# Patient Record
Sex: Female | Born: 2000 | Race: White | Hispanic: No | Marital: Single | Smoking: Never smoker
Health system: Southern US, Community
[De-identification: ages and names within clinical notes are randomized; demographics above are authoritative.]

## PROBLEM LIST (undated history)

## (undated) DIAGNOSIS — G43909 Migraine, unspecified, not intractable, without status migrainosus: Secondary | ICD-10-CM

## (undated) HISTORY — PX: WISDOM TOOTH EXTRACTION: SHX21

---
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
## 2019-01-07 ENCOUNTER — Other Ambulatory Visit: Payer: Self-pay | Admitting: Primary Care

## 2019-01-07 DIAGNOSIS — Z20822 Contact with and (suspected) exposure to covid-19: Secondary | ICD-10-CM

## 2019-01-08 LAB — NOVEL CORONAVIRUS, NAA: SARS-CoV-2, NAA: NOT DETECTED

## 2019-07-01 ENCOUNTER — Other Ambulatory Visit: Payer: Self-pay | Admitting: Neurology

## 2019-07-01 DIAGNOSIS — G43019 Migraine without aura, intractable, without status migrainosus: Secondary | ICD-10-CM

## 2019-07-11 ENCOUNTER — Other Ambulatory Visit: Payer: Self-pay

## 2019-07-11 ENCOUNTER — Ambulatory Visit
Admission: RE | Admit: 2019-07-11 | Discharge: 2019-07-11 | Disposition: A | Discharge status: Home / Self Care | Payer: BC Managed Care – PPO | Source: Ambulatory Visit | Attending: Neurology | Admitting: Neurology

## 2019-07-11 DIAGNOSIS — G43019 Migraine without aura, intractable, without status migrainosus: Secondary | ICD-10-CM | POA: Insufficient documentation

## 2020-07-28 ENCOUNTER — Ambulatory Visit
Admission: EM | Admit: 2020-07-28 | Discharge: 2020-07-28 | Disposition: A | Discharge status: Home / Self Care | Payer: BC Managed Care – PPO | Attending: Family Medicine | Admitting: Family Medicine

## 2020-07-28 ENCOUNTER — Other Ambulatory Visit: Payer: Self-pay

## 2020-07-28 ENCOUNTER — Encounter: Payer: Self-pay | Admitting: Emergency Medicine

## 2020-07-28 DIAGNOSIS — N3001 Acute cystitis with hematuria: Secondary | ICD-10-CM | POA: Insufficient documentation

## 2020-07-28 DIAGNOSIS — R300 Dysuria: Secondary | ICD-10-CM | POA: Diagnosis not present

## 2020-07-28 LAB — URINALYSIS, COMPLETE (UACMP) WITH MICROSCOPIC
Bilirubin Urine: NEGATIVE
Glucose, UA: NEGATIVE mg/dL
Nitrite: NEGATIVE
Protein, ur: NEGATIVE mg/dL
Specific Gravity, Urine: 1.025 (ref 1.005–1.030)
pH: 5.5 (ref 5.0–8.0)

## 2020-07-28 MED ORDER — PHENAZOPYRIDINE HCL 200 MG PO TABS: 200.0000 mg | ORAL_TABLET | Freq: Three times a day (TID) | ORAL | 0 refills | Status: AC

## 2020-07-28 MED ORDER — NITROFURANTOIN MONOHYD MACRO 100 MG PO CAPS: 100.0000 mg | ORAL_CAPSULE | Freq: Two times a day (BID) | ORAL | 0 refills | Status: AC

## 2020-07-28 NOTE — ED Provider Notes (Signed)
[facility]    CSN: [id number] Arrival date & time: 07/28/20  1506      History   Chief Complaint Chief Complaint  Patient presents with  . Dysuria    HPI Abigail Schaefer is a 20 y.o. female presenting for onset of dysuria, urinary frequency and urgency as well as lower abdominal cramping today.  She states that she is constipated at times and only has had small amounts of urine put out.  She denies any fever, fatigue, back pain, nausea/vomiting, hematuria, abnormal vaginal discharge/itching/odor or concerns for STIs.  Has not taken any over-the-counter medication for symptoms.  She has no other complaints or concerns.  HPI  Past Medical History:  Diagnosis Date  . Migraine     Patient Active Problem List   Diagnosis Date Noted  . 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 Lateral subluxation of patella, right, initial encounter 06/03/2014    Past Surgical History:  Procedure Laterality Date  . WISDOM TOOTH EXTRACTION      OB History   No obstetric history on file.      Home Medications    Prior to Admission medications   Medication Sig Start Date End Date Taking? Authorizing Provider  nitrofurantoin, macrocrystal-monohydrate, (MACROBID) 100 MG capsule Take 1 capsule (100 mg total) by mouth 2 (two) times daily for 5 days. 07/28/20 08/02/20 Yes Shirlee Latch, PA-C  phenazopyridine (PYRIDIUM) 200 MG tablet Take 1 tablet (200 mg total) by mouth 3 (three) times daily. 07/28/20  Yes Eusebio Friendly B, PA-C  ARIPiprazole (ABILIFY) 2 MG tablet Take 2 mg by mouth every evening. 11/27/17   [provider]  buPROPion (WELLBUTRIN XL) 150 MG 24 hr tablet Take 150 mg by mouth daily. 12/19/17   [provider]  clonazePAM (KLONOPIN) 0.5 MG tablet Take 0.5 mg by mouth daily as needed for anxiety. 12/04/17   [provider]  sertraline (ZOLOFT) 100 MG tablet Take 150 mg by mouth daily. 12/04/17   [provider]    Family  History History reviewed. No pertinent family history.  Social History Social History   Tobacco Use  . Smoking status: Never Smoker  . Smokeless tobacco: Never Used  Vaping Use  . Vaping Use: Never used  Substance Use Topics  . Alcohol use: No  . Drug use: No     Allergies   Covid-19 (mrna) vaccine and Peanut-containing drug products   Review of Systems Review of Systems  Constitutional: Negative for chills and fever.  Gastrointestinal: Positive for abdominal pain. Negative for diarrhea, nausea and vomiting.  Genitourinary: Positive for dysuria, frequency and urgency. Negative for decreased urine volume, flank pain, hematuria, pelvic pain, vaginal bleeding, vaginal discharge and vaginal pain.  Musculoskeletal: Negative for back pain.  Skin: Negative for rash.     Physical Exam Triage Vital Signs ED Triage Vitals  Enc Vitals Group     BP 07/28/20 1534 121/81     Pulse Rate 07/28/20 1534 97     Resp 07/28/20 1534 18     Temp 07/28/20 1534 98.5 F (36.9 C)     Temp Source 07/28/20 1534 Oral     SpO2 07/28/20 1534 99 %     Weight 07/28/20 1532 190 lb 0.6 oz (86.2 kg)     Height 07/28/20 1532 5\' 5"  (1.651 m)     Head Circumference --      Peak Flow --      Pain Score 07/28/20 1532 2  Pain Loc --      Pain Edu? --      Excl. in GC? --    No data found.  Updated Vital Signs BP 121/81 (BP Location: Right Arm)   Pulse 97   Temp 98.5 F (36.9 C) (Oral)   Resp 18   Ht 5\' 5"  (1.651 m)   Wt 190 lb 0.6 oz (86.2 kg)   SpO2 99%   BMI 31.62 kg/m       Physical Exam Vitals and nursing note reviewed.  Constitutional:      General: She is not in acute distress.    Appearance: Normal appearance. She is not ill-appearing or toxic-appearing.  HENT:     Head: Normocephalic and atraumatic.  Eyes:     General: No scleral icterus.       Right eye: No discharge.        Left eye: No discharge.     Conjunctiva/sclera: Conjunctivae normal.  Cardiovascular:     Rate  and Rhythm: Normal rate and regular rhythm.     Heart sounds: Normal heart sounds.  Pulmonary:     Effort: Pulmonary effort is normal. No respiratory distress.     Breath sounds: Normal breath sounds.  Abdominal:     Palpations: Abdomen is soft.     Tenderness: There is no abdominal tenderness. There is no right CVA tenderness or left CVA tenderness.  Musculoskeletal:     Cervical back: Neck supple.  Skin:    General: Skin is dry.  Neurological:     General: No focal deficit present.     Mental Status: She is alert. Mental status is at baseline.     Motor: No weakness.     Gait: Gait normal.  Psychiatric:        Mood and Affect: Mood normal.        Behavior: Behavior normal.        Thought Content: Thought content normal.      UC Treatments / Results  Labs (all labs ordered are listed, but only abnormal results are displayed) Labs Reviewed  URINALYSIS, COMPLETE (UACMP) WITH MICROSCOPIC - Abnormal; Notable for the following components:      Result Value   APPearance HAZY (*)    Hgb urine dipstick TRACE (*)    Ketones, ur TRACE (*)    Leukocytes,Ua TRACE (*)    Bacteria, UA FEW (*)    All other components within normal limits  URINE CULTURE    EKG   Radiology No results found.  Procedures Procedures (including critical care time)  Medications Ordered in UC Medications - No data to display  Initial Impression / Assessment and Plan / UC Course  I have reviewed the triage vital signs and the nursing notes.  Pertinent labs & imaging results that were available during my care of the patient were reviewed by me and considered in my medical decision making (see chart for details).   20 year old female presenting for onset of dysuria, urinary frequency and urgency as well as lower abdominal cramping today.  Urinalysis is positive for trace blood, trace ketones, and trace leukocytes.  We will send urine for culture and treat for suspected UTI at this time with Macrobid.   Also sent Pyridium and encouraged supportive care with increasing rest and fluids.  Reviewed ED red flag signs and symptoms for urinary tract infections.   Final Clinical Impressions(s) / UC Diagnoses   Final diagnoses:  Acute cystitis with hematuria  Dysuria  Discharge Instructions     UTI: Based on either symptoms or urinalysis, you may have a urinary tract infection. We will send the urine for culture and call with results in a few days. Begin antibiotics at this time. Your symptoms should be much improved over the next 2-3 days. Increase rest and fluid intake. If for some reason symptoms are worsening or not improving after a couple of days or the urine culture determines the antibiotics you are taking will not treat the infection, the antibiotics may be changed. Return or go to ER for fever, back pain, worsening urinary pain, discharge, increased blood in urine. May take Tylenol or Motrin OTC for pain relief or consider AZO if no contraindications     ED Prescriptions    Medication Sig Dispense Auth. Provider   nitrofurantoin, macrocrystal-monohydrate, (MACROBID) 100 MG capsule Take 1 capsule (100 mg total) by mouth 2 (two) times daily for 5 days. 10 capsule Eusebio Friendly B, PA-C   phenazopyridine (PYRIDIUM) 200 MG tablet Take 1 tablet (200 mg total) by mouth 3 (three) times daily. 6 tablet Gareth Morgan     PDMP not reviewed this encounter.   Shirlee Latch, PA-C 07/28/20 1631

## 2020-07-28 NOTE — Discharge Instructions (Signed)

## 2020-07-28 NOTE — ED Triage Notes (Signed)
Pt c/o dysuria, lower abdominal cramping, urinary frequency and retention. Started this morning. Denies lower back pain or fever.

## 2020-07-31 LAB — URINE CULTURE: Culture: 50000 — AB

## 2020-08-25 ENCOUNTER — Other Ambulatory Visit: Payer: Self-pay

## 2020-08-25 ENCOUNTER — Ambulatory Visit
Admission: EM | Admit: 2020-08-25 | Discharge: 2020-08-25 | Disposition: A | Discharge status: Home / Self Care | Payer: BC Managed Care – PPO | Attending: Family Medicine | Admitting: Family Medicine

## 2020-08-25 ENCOUNTER — Encounter: Payer: Self-pay | Admitting: Emergency Medicine

## 2020-08-25 DIAGNOSIS — N3000 Acute cystitis without hematuria: Secondary | ICD-10-CM | POA: Insufficient documentation

## 2020-08-25 LAB — URINALYSIS, COMPLETE (UACMP) WITH MICROSCOPIC
Bilirubin Urine: NEGATIVE
Glucose, UA: NEGATIVE mg/dL
Hgb urine dipstick: NEGATIVE
Ketones, ur: NEGATIVE mg/dL
Leukocytes,Ua: NEGATIVE
Nitrite: POSITIVE — AB
Protein, ur: NEGATIVE mg/dL
Specific Gravity, Urine: 1.02 (ref 1.005–1.030)
pH: 6 (ref 5.0–8.0)

## 2020-08-25 MED ORDER — CEPHALEXIN 500 MG PO CAPS: 500.0000 mg | ORAL_CAPSULE | Freq: Two times a day (BID) | ORAL | 0 refills | Status: AC

## 2020-08-25 NOTE — ED Triage Notes (Signed)
Patient states she was being treated for a UTI about 1 week ago. She states she took her antibiotics for 2 days and her symptoms resolved and she forgot to continue to taker her medication. She states her symptoms have returned, dysuria and urinary urgency.

## 2020-08-25 NOTE — Discharge Instructions (Signed)

## 2020-08-25 NOTE — ED Provider Notes (Signed)
[facility]    CSN: [id number] Arrival date & time: 08/25/20  1135      History   Chief Complaint Chief Complaint  Patient presents with  . Dysuria  . Urinary Urgency    HPI Abigail Schaefer is a 20 y.o. female presenting for ~2-3 day history of dysuria, urinary frequency and urgency as well as bladder spasms.  Patient was seen by me on 07/28/2020 and treated for UTI with Macrobid.  Patient states that she took about 2 days of the medication and felt better and forgot to take the rest of it.  She said that she is back today because her symptoms return.  Has been taking over-the-counter AZO with the last dose yesterday.  Denies any fever, fatigue, pelvic pain or back pain.  No hematuria, abnormal vaginal discharge/itching or odor.  No concern for STIs.  No other complaints.  HPI  Past Medical History:  Diagnosis Date  . Migraine     Patient Active Problem List   Diagnosis Date Noted  . 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 Lateral subluxation of patella, right, initial encounter 06/03/2014    Past Surgical History:  Procedure Laterality Date  . WISDOM TOOTH EXTRACTION      OB History   No obstetric history on file.      Home Medications    Prior to Admission medications   Medication Sig Start Date End Date Taking? Authorizing Provider  cephALEXin (KEFLEX) 500 MG capsule Take 1 capsule (500 mg total) by mouth 2 (two) times daily for 7 days. 08/25/20 09/01/20 Yes Shirlee Latch, PA-C  phenazopyridine (PYRIDIUM) 200 MG tablet Take 1 tablet (200 mg total) by mouth 3 (three) times daily. 07/28/20  Yes Eusebio Friendly B, PA-C  ARIPiprazole (ABILIFY) 2 MG tablet Take 2 mg by mouth every evening. 11/27/17   [provider]  buPROPion (WELLBUTRIN XL) 150 MG 24 hr tablet Take 150 mg by mouth daily. 12/19/17   [provider]  clonazePAM (KLONOPIN) 0.5 MG tablet Take 0.5 mg by mouth daily as needed for anxiety. 12/04/17   [provider]   sertraline (ZOLOFT) 100 MG tablet Take 150 mg by mouth daily. 12/04/17   [provider]    Family History History reviewed. No pertinent family history.  Social History Social History   Tobacco Use  . Smoking status: Never Smoker  . Smokeless tobacco: Never Used  Vaping Use  . Vaping Use: Never used  Substance Use Topics  . Alcohol use: No  . Drug use: No     Allergies   Covid-19 (mrna) vaccine and Peanut-containing drug products   Review of Systems Review of Systems  Constitutional: Negative for chills and fever.  Gastrointestinal: Negative for abdominal pain, diarrhea, nausea and vomiting.  Genitourinary: Positive for dysuria, frequency and urgency. Negative for decreased urine volume, flank pain, hematuria, pelvic pain, vaginal bleeding, vaginal discharge and vaginal pain.  Musculoskeletal: Negative for back pain.  Skin: Negative for rash.     Physical Exam Triage Vital Signs ED Triage Vitals  Enc Vitals Group     BP 08/25/20 1240 114/78     Pulse Rate 08/25/20 1240 75     Resp 08/25/20 1240 18     Temp 08/25/20 1240 98.3 F (36.8 C)     Temp Source 08/25/20 1240 Oral     SpO2 08/25/20 1240 100 %     Weight 08/25/20 1239 210 lb (95.3 kg)     Height 08/25/20  1239 5\' 5"  (1.651 m)     Head Circumference --      Peak Flow --      Pain Score 08/25/20 1239 5     Pain Loc --      Pain Edu? --      Excl. in GC? --    No data found.  Updated Vital Signs BP 114/78 (BP Location: Left Arm)   Pulse 75   Temp 98.3 F (36.8 C) (Oral)   Resp 18   Ht 5\' 5"  (1.651 m)   Wt 210 lb (95.3 kg)   SpO2 100%   BMI 34.95 kg/m      Physical Exam Vitals and nursing note reviewed.  Constitutional:      General: She is not in acute distress.    Appearance: Normal appearance. She is not ill-appearing or toxic-appearing.  HENT:     Head: Normocephalic and atraumatic.  Eyes:     General: No scleral icterus.       Right eye: No discharge.        Left eye: No  discharge.     Conjunctiva/sclera: Conjunctivae normal.  Cardiovascular:     Rate and Rhythm: Normal rate and regular rhythm.     Heart sounds: Normal heart sounds.  Pulmonary:     Effort: Pulmonary effort is normal. No respiratory distress.     Breath sounds: Normal breath sounds.  Abdominal:     Palpations: Abdomen is soft.     Tenderness: There is no abdominal tenderness. There is no right CVA tenderness or left CVA tenderness.  Musculoskeletal:     Cervical back: Neck supple.  Skin:    General: Skin is dry.  Neurological:     General: No focal deficit present.     Mental Status: She is alert. Mental status is at baseline.     Motor: No weakness.     Gait: Gait normal.  Psychiatric:        Mood and Affect: Mood normal.        Behavior: Behavior normal.        Thought Content: Thought content normal.      UC Treatments / Results  Labs (all labs ordered are listed, but only abnormal results are displayed) Labs Reviewed  URINALYSIS, COMPLETE (UACMP) WITH MICROSCOPIC - Abnormal; Notable for the following components:      Result Value   Color, Urine ORANGE (*)    Nitrite POSITIVE (*)    Bacteria, UA FEW (*)    All other components within normal limits  URINE CULTURE    EKG   Radiology No results found.  Procedures Procedures (including critical care time)  Medications Ordered in UC Medications - No data to display  Initial Impression / Assessment and Plan / UC Course  I have reviewed the triage vital signs and the nursing notes.  Pertinent labs & imaging results that were available during my care of the patient were reviewed by me and considered in my medical decision making (see chart for details).   20 year old female returning for urinary symptoms.  Seen last month by me and urine culture was positive for E. coli that was pansensitive.  UA today shows positive nitrites.  We will reculture urine and treat with Keflex this time.  Encouraged her to make sure  that she takes all of the antibiotic this time and increase rest and fluids.  Advised she can continue with the AZO.  Reviewed return and ED precautions for UTIs  the patient.   Final Clinical Impressions(s) / UC Diagnoses   Final diagnoses:  Acute cystitis without hematuria     Discharge Instructions     UTI: Based on either symptoms or urinalysis, you may have a urinary tract infection. We will send the urine for culture and call with results in a few days. Begin antibiotics at this time. Your symptoms should be much improved over the next 2-3 days. Increase rest and fluid intake. If for some reason symptoms are worsening or not improving after a couple of days or the urine culture determines the antibiotics you are taking will not treat the infection, the antibiotics may be changed. Return or go to ER for fever, back pain, worsening urinary pain, discharge, increased blood in urine. May take Tylenol or Motrin OTC for pain relief or consider AZO if no contraindications     ED Prescriptions    Medication Sig Dispense Auth. Provider   cephALEXin (KEFLEX) 500 MG capsule Take 1 capsule (500 mg total) by mouth 2 (two) times daily for 7 days. 14 capsule Shirlee Latch, PA-C     PDMP not reviewed this encounter.   Shirlee Latch, PA-C 08/25/20 1322

## 2020-08-28 LAB — URINE CULTURE: Culture: 70000 — AB

## 2022-02-16 IMAGING — MR MR HEAD W/O CM
12 series · 45 of 48 positions shown · non-contrast
Comparison: None.

CLINICAL DATA: Migraines.

EXAM:
MRI HEAD WITHOUT CONTRAST
TECHNIQUE: Multiplanar, multiecho pulse sequences of the brain and surrounding
structures were obtained without intravenous contrast.

[Series 5: ax dwi_tracew · axial · 3.0mm · 0.60mm/px · z∈[-102,+53]mm · 4 of 48 slices shown]
[im 1/48]
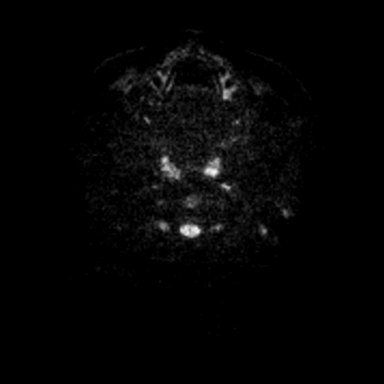
[im 16/48]
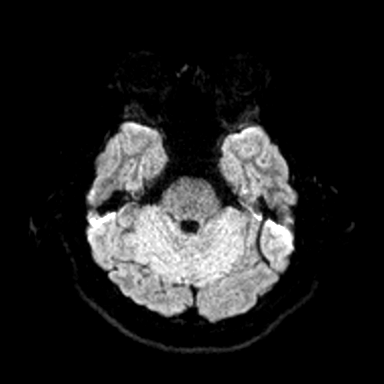
[im 32/48]
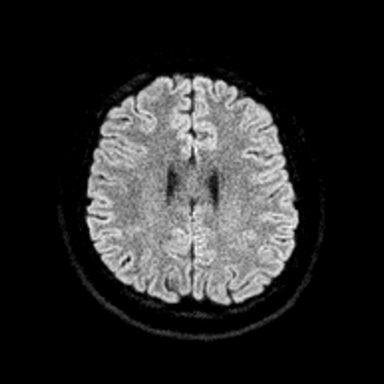
[im 48/48]
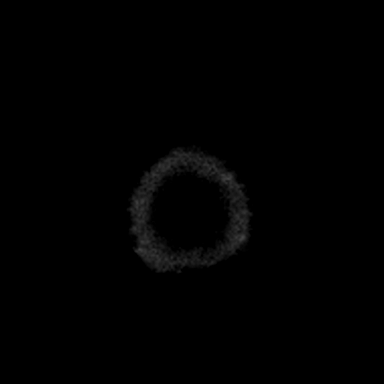

[Series 6: ax dwi_adc · axial · 3.0mm · 0.60mm/px · z∈[-102,+53]mm · 4 of 48 slices shown]
[im 1/48]
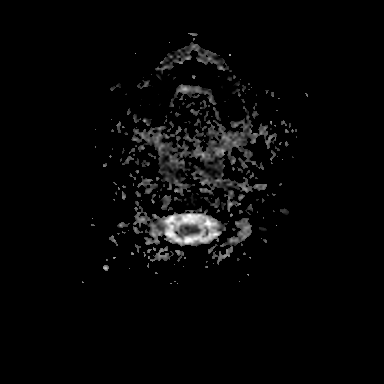
[im 16/48]
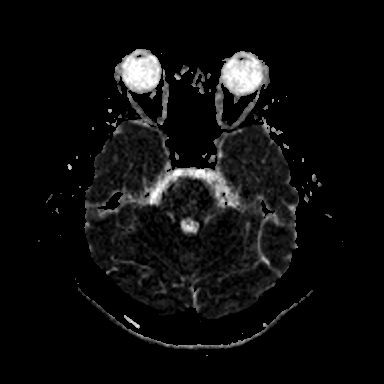
[im 32/48]
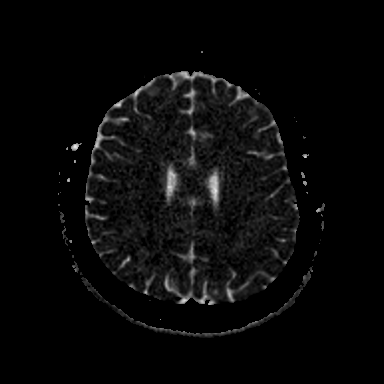
[im 48/48]
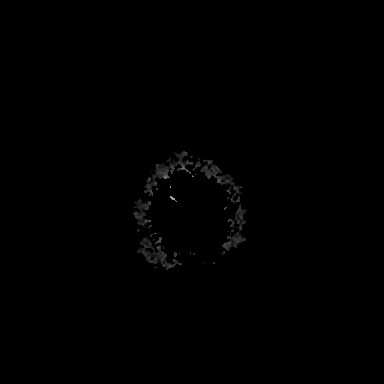

[Series 7: cor dwi_tracew · coronal · 5.0mm · 0.60mm/px · 3 of 38 slices shown]
[im 1/38]
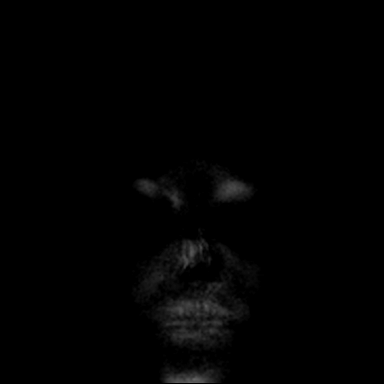
[im 19/38]
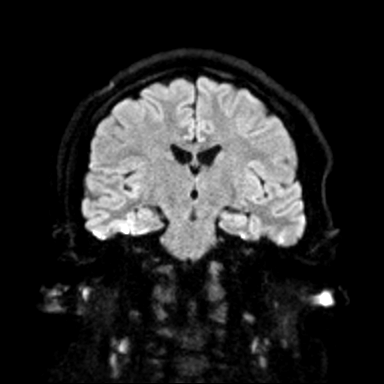
[im 38/38]
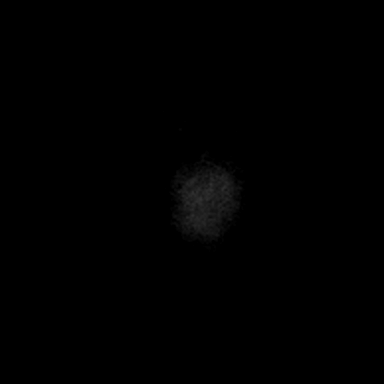

[Series 8: cor dwi_adc · coronal · 5.0mm · 0.60mm/px · 3 of 38 slices shown]
[im 1/38]
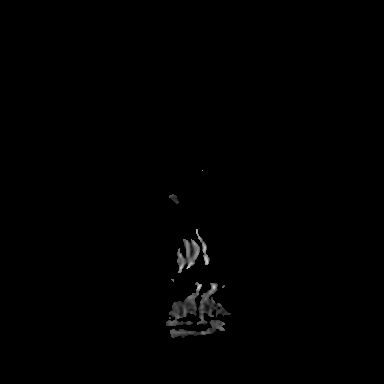
[im 19/38]
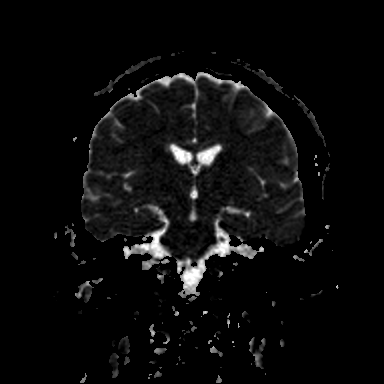
[im 38/38]
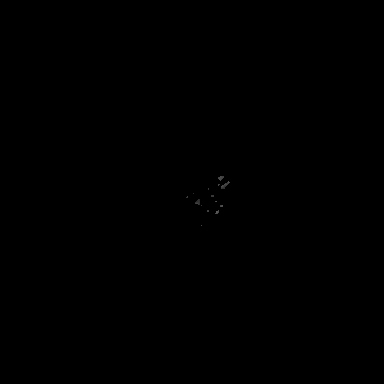

[Series 9: T1 · sagittal · 5.0mm · 0.62mm/px · 2 of 23 slices shown (1 of 2)]
[im 1/23]
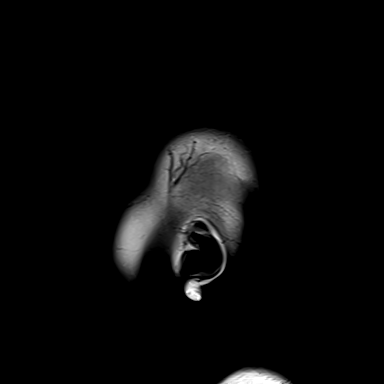
[im 23/23]
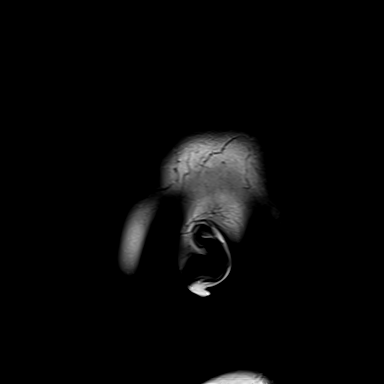

[Series 10: T2 · axial · 5.0mm · 0.53mm/px · z∈[-91,+53]mm · 2 of 25 slices shown (1 of 2)]
[im 1/25]
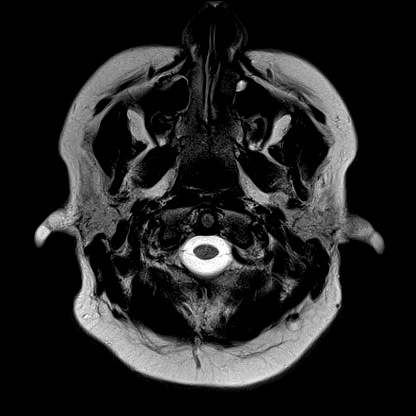
[im 25/25]
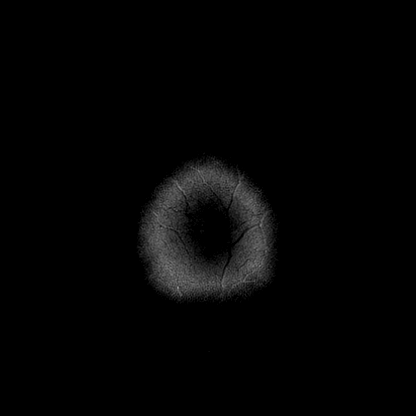

[Series 11: mag_images · axial · 3.0mm · 0.90mm/px · z∈[-107,+70]mm · 4 of 60 slices shown]
[im 1/60]
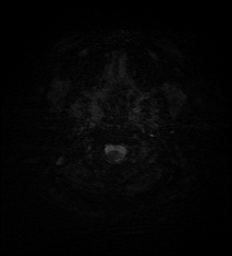
[im 20/60]
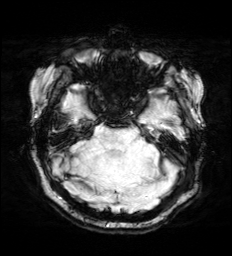
[im 40/60]
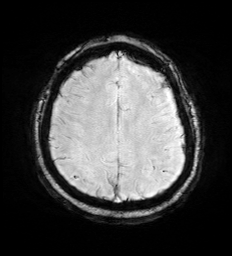
[im 60/60]
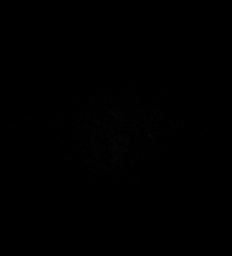

[Series 12: pha_images · axial · 3.0mm · 0.90mm/px · z∈[-107,+70]mm · 4 of 60 slices shown]
[im 1/60]
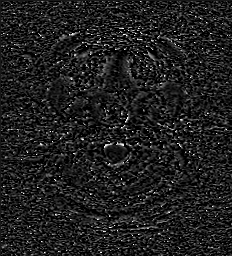
[im 20/60]
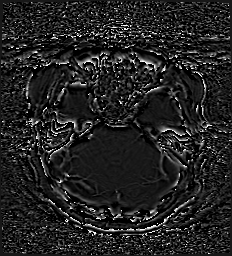
[im 40/60]
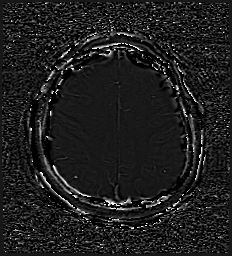
[im 60/60]
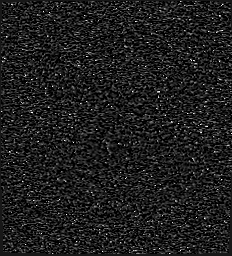

[Series 13: swi_images · axial · 3.0mm · 0.90mm/px · z∈[-107,+70]mm · 4 of 60 slices shown]
[im 1/60]
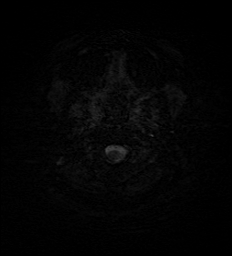
[im 20/60]
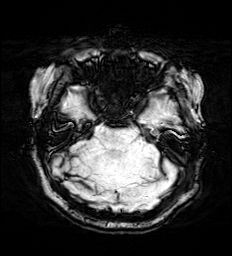
[im 40/60]
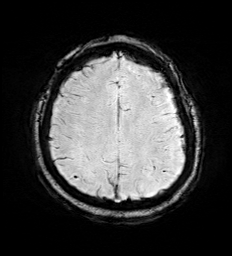
[im 60/60]
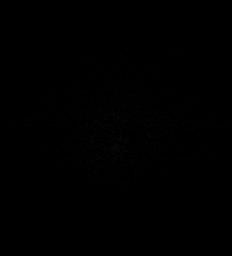

[Series 15: FLAIR · axial · 3.0mm · 0.53mm/px · z∈[-100,+62]mm · 4 of 55 slices shown]
[im 1/55]
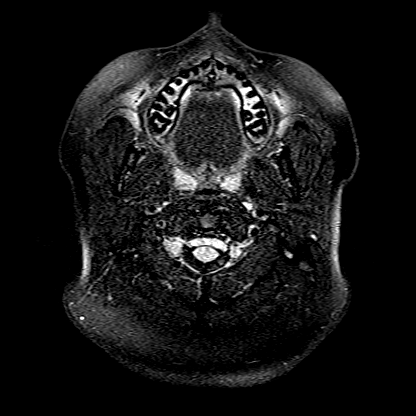
[im 19/55]
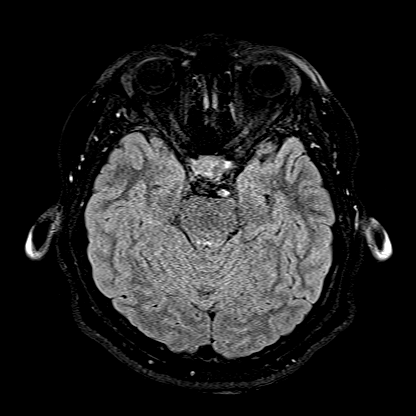
[im 37/55]
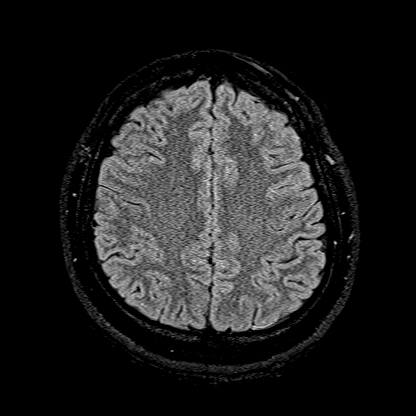
[im 55/55]
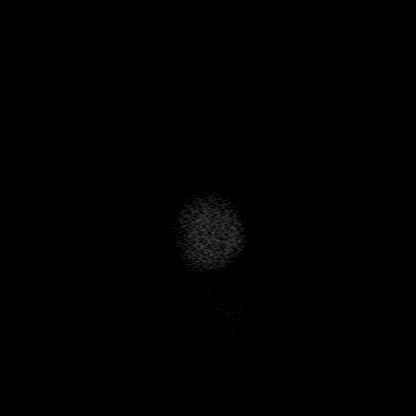

[Series 16: T1 · axial · 1.0mm · 0.98mm/px · z∈[-99,+60]mm · 9 of 160 slices shown (2 of 2)]
[im 1/160]
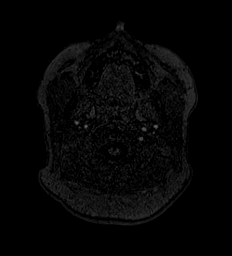
[im 15/160]
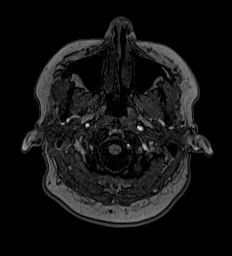
[im 29/160]
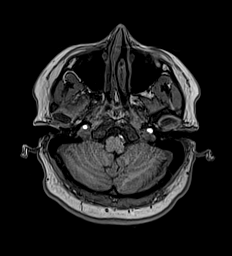
[im 44/160]
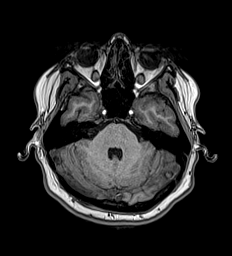
[im 73/160]
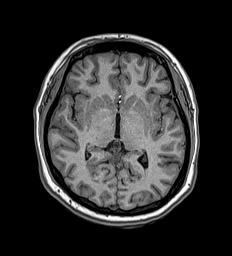
[im 87/160]
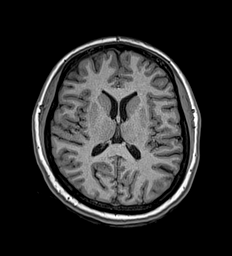
[im 116/160]
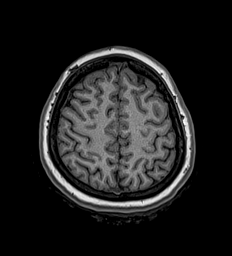
[im 131/160]
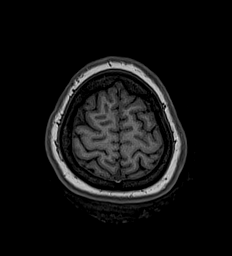
[im 160/160]
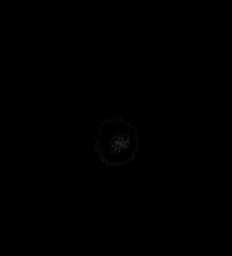

[Series 17: T2 · coronal · 5.0mm · 0.57mm/px · 2 of 29 slices shown (2 of 2)]
[im 1/29]
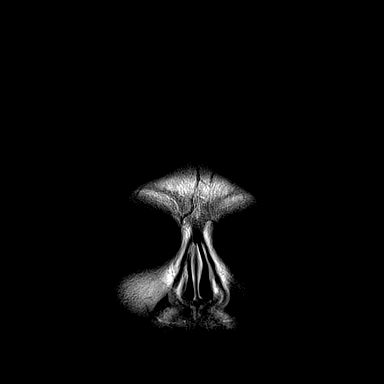
[im 29/29]
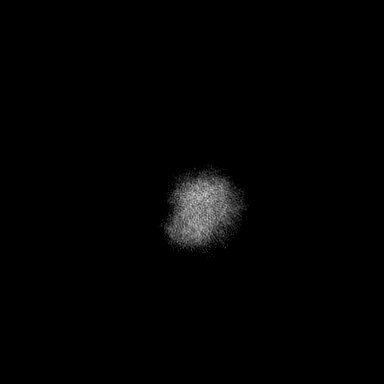

[45 of 48 positions shown; findings below may reference images not displayed]

FINDINGS: Brain: No acute infarction, hemorrhage, hydrocephalus, extra-axial
collection or mass lesion.

The brain parenchyma has normal morphology and signal
characteristics.

Vascular: Normal flow voids.

Skull and upper cervical spine: Normal marrow signal.

Sinuses/Orbits: Bubbly secretion in the pterygoid recess of the left
sphenoid sinus.
IMPRESSION: 1. Normal MRI of the brain.
2. Bubly secretion in the pterygoid recess of the left sphenoid
sinus, which may represent acute sinusitis .

## 2022-11-11 ENCOUNTER — Other Ambulatory Visit: Payer: Self-pay | Admitting: Orthopedic Surgery

## 2022-11-11 DIAGNOSIS — M25561 Pain in right knee: Secondary | ICD-10-CM

## 2022-11-23 ENCOUNTER — Encounter: Payer: Self-pay | Admitting: Orthopedic Surgery

## 2022-11-25 ENCOUNTER — Ambulatory Visit
Admission: RE | Admit: 2022-11-25 | Discharge: 2022-11-25 | Disposition: A | Discharge status: Home / Self Care | Payer: BC Managed Care – PPO | Source: Ambulatory Visit | Attending: Orthopedic Surgery | Admitting: Orthopedic Surgery

## 2022-11-25 DIAGNOSIS — M25561 Pain in right knee: Secondary | ICD-10-CM

## 2023-10-10 ENCOUNTER — Inpatient Hospital Stay

## 2023-10-10 ENCOUNTER — Inpatient Hospital Stay: Attending: Oncology | Admitting: Licensed Clinical Social Worker

## 2023-10-10 ENCOUNTER — Encounter: Payer: Self-pay | Admitting: Licensed Clinical Social Worker

## 2023-10-10 DIAGNOSIS — Z800 Family history of malignant neoplasm of digestive organs: Secondary | ICD-10-CM | POA: Diagnosis present

## 2023-10-10 DIAGNOSIS — Z803 Family history of malignant neoplasm of breast: Secondary | ICD-10-CM | POA: Insufficient documentation

## 2023-10-10 NOTE — Progress Notes (Signed)
 REFERRING PROVIDER: Orlando Comer Hoot, FNP [address] [place],  [place] [address]  PRIMARY PROVIDER:  Sator Schaefer, Jasna, MD (Inactive)  PRIMARY REASON FOR VISIT:  1. Family Schaefer of breast Schaefer   2. Family Schaefer of colon Schaefer      Schaefer OF PRESENT ILLNESS:   Abigail Schaefer, a 23 y.o. female, was seen for a [facility] Schaefer genetics consultation at the request of Comer Crystal Orlando, FNP due to a family Schaefer of breast Schaefer.  Abigail Schaefer presents to clinic today to discuss the possibility of a hereditary predisposition to Schaefer, genetic testing, and to further clarify her future Schaefer risks, as well as potential Schaefer risks for family members.   Schaefer Schaefer:  Abigail Schaefer is a 24 y.o. female with no personal Schaefer of Schaefer.    RISK FACTORS:  Menarche was at age 83.  Ovaries intact: yes.  Hysterectomy: no.  Menopausal status: premenopausal.  Colonoscopy: n/a Mammogram within the last year: n/a. Number of breast biopsies: 0. Up to date with pelvic exams: yes.  Past Medical Schaefer:  Diagnosis Date   Migraine     Past Surgical Schaefer:  Procedure Laterality Date   WISDOM TOOTH EXTRACTION      FAMILY Schaefer:  We obtained a detailed, 4-generation family Schaefer.  Significant diagnoses are listed below: Family Schaefer  Problem Relation Age of Onset   Breast Schaefer Schaefer 24       unsure if GT done   Heart Problems Paternal Uncle 81   Breast Schaefer Maternal Grandmother        dx 44   Thyroid Schaefer Maternal Grandmother        dx 68   Colon Schaefer Maternal Grandfather        dx 60s   Non-Hodgkin's lymphoma Maternal Grandfather        dx 89s   Heart Problems Paternal Grandfather        d. 44s   Abigail Schaefer had breast Schaefer at 38 and is living at 65, unsure if she had genetic testing. Maternal grandmother had breast Schaefer at 80 and thyroid Schaefer at 66. Maternal grandfather had Non-Hodgkin's lymphoma in his  53s and colon Schaefer in his 54s. Two of his brothers also had Schaefer (brain and lung Schaefer).  Abigail Schaefer father passed at 81. No known cancers on this side of the family. A paternal uncle died of heart issue around age 56 and paternal grandfather died in his 74s of possible heart issue.  Abigail Schaefer is unaware of previous family Schaefer of genetic testing for hereditary Schaefer risks. There is no reported Ashkenazi Jewish ancestry. There is no known consanguinity.    GENETIC COUNSELING ASSESSMENT: Abigail Schaefer is a 23 y.o. 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 We, therefore, discussed and recommended the following at today's visit.   DISCUSSION: We discussed that approximately 10% of breast Schaefer is hereditary. Most cases of hereditary breast Schaefer are associated with BRCA1/2 genes, although there are other genes associated with hereditary Schaefer as well. Cancers and risks are gene specific. We discussed that testing is beneficial for several reasons including knowing about Schaefer risks, identifying potential screening and risk-reduction options that may be appropriate, and to understand if other family members could be at risk for Schaefer and allow them to undergo genetic testing.   We reviewed the characteristics, features and  inheritance patterns of hereditary Schaefer syndromes. We also discussed genetic testing, including the appropriate family members to test, the process of testing, insurance coverage and turn-around-time for results. We discussed the implications of a negative, positive and/or variant of uncertain significant result. We recommended Abigail Schaefer pursue genetic testing for the [facility] CancerNext+RNA gene panel.   The [facility] CancerNext+RNAinsight Panel includes sequencing, rearrangement analysis, and RNA analysis for the following 40 genes: APC, ATM, BAP1, BARD1,  BMPR1A, BRCA1, BRCA2, BRIP1, CDH1, CDKN2A, CHEK2, FH, FLCN, MET, MLH1, MSH2, MSH6, MUTYH, NF1, NTHL1, PALB2, PMS2, PTEN, RAD51C, RAD51D, RPS20, SMAD4, STK11, TP53, TSC1, TSC2, and VHL (sequencing and deletion/duplication); AXIN2, HOXB13, MBD4, MSH3, POLD1 and POLE (sequencing only); EPCAM and GREM1 (deletion/duplication only).  Based on Abigail Schaefer, she meets medical criteria for genetic testing. Though Abigail Schaefer is not personally affected, there are no affected family members that are available to undergo hereditary Schaefer testing at this time.  Therefore, Abigail Schaefer the most informative family member available. Despite that she meets criteria, she may still have an out of pocket cost.   We discussed that some people do not want to undergo genetic testing due to fear of genetic discrimination.  A federal law called the Genetic Information Non-Discrimination Act (GINA) of 2008 helps protect individuals against genetic discrimination based on their genetic test results.  It impacts both health insurance and employment.  For health insurance, it protects against increased premiums, being kicked off insurance or being forced to take a test in order to be insured.  For employment it protects against hiring, firing and promoting decisions based on genetic test results.  Health status due to a Schaefer diagnosis is not protected under GINA.  This law does not protect life insurance, disability insurance, or other types of insurance.   PLAN: After considering the risks, benefits, and limitations, Abigail Schaefer did not wish to pursue genetic testing at today's visit. We understand this decision and remain available to coordinate genetic testing at any time in the future. We, therefore, recommend Abigail Schaefer continue to follow the Schaefer screening guidelines given by her primary healthcare provider.  Based on Abigail Schaefer, we recommended her Schaefer have genetic  counseling and testing. [name] will let us  know if we can be of any assistance in coordinating genetic counseling and/or testing for this family member.   Ms. Tegethoff questions were answered to her satisfaction today. Our contact information was provided should additional questions or concerns arise. Thank you for the referral and allowing us  to share in the care of your patient.   Dena Cary, MS, [facility] Genetic Counselor [email or web address] Phone: [phone]  40 minutes were spent on the date of the encounter in service to the patient including preparation, face-to-face consultation, documentation and care coordination. Dr. Delinda was available for discussion regarding this case.   _______________________________________________________________________ For Office Staff:  Number of people involved in session: 1 Was an Intern/ student involved with case: no
